# Patient Record
Sex: Male | Born: 1970 | Hispanic: Yes | Marital: Married | Smoking: Current every day smoker
Health system: Southern US, Community
[De-identification: ages and names within clinical notes are randomized; demographics above are authoritative.]

## PROBLEM LIST (undated history)

## (undated) DIAGNOSIS — Z973 Presence of spectacles and contact lenses: Secondary | ICD-10-CM

## (undated) HISTORY — PX: ORIF FIBULA FRACTURE: SHX2121

---

## 2019-09-10 ENCOUNTER — Emergency Department (HOSPITAL_COMMUNITY)
Admission: EM | Admit: 2019-09-10 | Discharge: 2019-09-11 | Disposition: A | Discharge status: Home / Self Care | Payer: Self-pay | Attending: Emergency Medicine | Admitting: Emergency Medicine

## 2019-09-10 ENCOUNTER — Encounter (HOSPITAL_COMMUNITY): Payer: Self-pay | Admitting: Emergency Medicine

## 2019-09-10 ENCOUNTER — Emergency Department (HOSPITAL_COMMUNITY): Payer: Self-pay

## 2019-09-10 DIAGNOSIS — Y9289 Other specified places as the place of occurrence of the external cause: Secondary | ICD-10-CM | POA: Insufficient documentation

## 2019-09-10 DIAGNOSIS — Y939 Activity, unspecified: Secondary | ICD-10-CM | POA: Insufficient documentation

## 2019-09-10 DIAGNOSIS — W11XXXA Fall on and from ladder, initial encounter: Secondary | ICD-10-CM | POA: Insufficient documentation

## 2019-09-10 DIAGNOSIS — Y999 Unspecified external cause status: Secondary | ICD-10-CM | POA: Insufficient documentation

## 2019-09-10 DIAGNOSIS — W19XXXA Unspecified fall, initial encounter: Secondary | ICD-10-CM

## 2019-09-10 DIAGNOSIS — S82891A Other fracture of right lower leg, initial encounter for closed fracture: Secondary | ICD-10-CM

## 2019-09-10 DIAGNOSIS — S8251XA Displaced fracture of medial malleolus of right tibia, initial encounter for closed fracture: Secondary | ICD-10-CM | POA: Insufficient documentation

## 2019-09-10 HISTORY — DX: Other fracture of right lower leg, initial encounter for closed fracture: S82.891A

## 2019-09-10 NOTE — ED Triage Notes (Signed)
Patient reports he slipped and fell off ladder yesterday. Patient presents today with severe swelling and bruising to RLE. Primary location of swelling is R ankle but bruising extends to knee. Denies taking blood thinners.

## 2019-09-11 MED ORDER — IBUPROFEN 600 MG PO TABS
600.0000 mg | ORAL_TABLET | Freq: Four times a day (QID) | ORAL | 0 refills | Status: DC | PRN
Start: 2019-09-11 — End: 2019-09-17

## 2019-09-11 NOTE — ED Notes (Signed)
Ortho paged for splint and crutches 

## 2019-09-11 NOTE — ED Provider Notes (Signed)
[facility] EMERGENCY DEPARTMENT Provider Note   CSN: [id number] Arrival date & time: 09/10/19  1714     History Chief Complaint  Patient presents with  . Leg Injury    Jerry Webb is a 49 y.o. male.  No language interpreter was used (Interpreter was offered, but patient states it is not necessary.).        Jerry Webb is a 49 y.o. male, patient with no known past medical history, presenting to the ED with right leg injury that occurred morning of 7/20. Patient was standing on an extension ladder when the ladder slipped and the latch holding the ladder gave way. He slid to the ground, but the upper ladder section came down on his ankle.  He endorses mild to moderate, throbbing pain. Bruising and swelling.  Previous lateral maleous fracture with surgical repair 25-30 years ago. Denies anticoagulation.  Denies numbness, weakness, neck/back pain, head injury, or any other complaints or injuries.    History reviewed. No pertinent past medical history.  There are no problems to display for this patient.   History reviewed. No pertinent surgical history.     No family history on file.  Social History   Tobacco Use  . Smoking status: Not on file  . Smokeless tobacco: Never Used  Substance Use Topics  . Alcohol use: Yes  . Drug use: Not Currently    Home Medications Prior to Admission medications   Medication Sig Start Date End Date Taking? Authorizing Provider  ibuprofen (ADVIL) 600 MG tablet Take 1 tablet (600 mg total) by mouth every 6 (six) hours as needed. 09/11/19   Lucill Mauck, Hillard Danker, PA-C    Allergies    Patient has no known allergies.  Review of Systems   Review of Systems  Constitutional: Negative for diaphoresis.  Respiratory: Negative for shortness of breath.   Cardiovascular: Negative for chest pain.  Gastrointestinal: Negative for nausea and vomiting.  Musculoskeletal: Positive for arthralgias and joint swelling. Negative  for back pain and neck pain.  Skin: Negative for wound.  Neurological: Negative for syncope, weakness and numbness.    Physical Exam Updated Vital Signs BP (!) 147/102 (BP Location: Left Arm)   Pulse 72   Temp 98.5 F (36.9 C) (Temporal)   Resp 20   Ht 5\' 8"  (1.727 m)   Wt 81.6 kg   SpO2 96%   BMI 27.37 kg/m   Physical Exam Vitals and nursing note reviewed.  Constitutional:      General: He is not in acute distress.    Appearance: He is well-developed. He is not diaphoretic.  HENT:     Head: Normocephalic and atraumatic.  Eyes:     Conjunctiva/sclera: Conjunctivae normal.  Cardiovascular:     Rate and Rhythm: Normal rate and regular rhythm.     Pulses:          Dorsalis pedis pulses are 2+ on the right side and 2+ on the left side.       Posterior tibial pulses are 2+ on the right side and 2+ on the left side.     Comments: Pulses in the right lower extremity were palpated as well as Dopplered.  Pulmonary:     Effort: Pulmonary effort is normal.  Musculoskeletal:     Cervical back: Neck supple.     Comments: Swelling to right ankle. Tenderness over medial malleolus. Bruising to right ankle extending up medial calf. No tenderness or swelling into foot or lower leg.  No tenderness, swelling, deformity, instability to the right lower leg, knee, upper leg, or hip.  Normal motor function intact in all other extremities. No midline spinal tenderness.   Skin:    General: Skin is warm and dry.     Coloration: Skin is not pale.  Neurological:     Mental Status: He is alert.     Comments: Sensation light touch grossly intact in the right lower extremity into the toes. Motor function intact in the right toes.  Psychiatric:        Behavior: Behavior normal.     ED Results / Procedures / Treatments   Labs (all labs ordered are listed, but only abnormal results are displayed) Labs Reviewed - No data to display  EKG None  Radiology DG Tibia/Fibula Right  Result  Date: 09/10/2019 CLINICAL DATA:  Fall and ankle pain EXAM: RIGHT TIBIA AND FIBULA - 2 VIEW COMPARISON:  None. FINDINGS: There is a comminuted mildly displaced fracture seen through the medial malleolus. Overlying soft tissue swelling seen diffusely around the ankle. No proximal fracture is noted. There is diffuse pretibial soft tissue swelling. IMPRESSION: Comminuted mildly displaced medial malleolar fracture. Electronically Signed   By: Jonna Clark M.D.   On: 09/10/2019 19:27   DG Ankle Complete Right  Result Date: 09/10/2019 CLINICAL DATA:  Fall ankle pain EXAM: RIGHT ANKLE - COMPLETE 3+ VIEW COMPARISON:  None. FINDINGS: There is a comminuted mildly displaced fracture seen through the medial malleolus with mild widening of the medial clear space measuring up to 6 mm. There is diffuse overlying soft tissue swelling seen around the ankle. Prior fixation seen within the distal fibula. There is enthesophytes at the syndesmosis. A small ankle joint effusion is present. Dorsal osteophytes at the talonavicular joint IMPRESSION: Comminuted mildly displaced medial malleolar fracture. Electronically Signed   By: Jonna Clark M.D.   On: 09/10/2019 19:27   DG Foot Complete Right  Result Date: 09/10/2019 CLINICAL DATA:  Right ankle pain and bruising EXAM: RIGHT FOOT COMPLETE - 3+ VIEW COMPARISON:  None. FINDINGS: There is a comminuted mildly displaced medial malleolar fracture. There is widening of the medial clear space with overlying soft tissue swelling. Prior fixation is seen through the distal tibia. No other fractures are identified. IMPRESSION: Comminuted mildly displaced medial malleolar fracture. Electronically Signed   By: Jonna Clark M.D.   On: 09/10/2019 19:26    Procedures Procedures (including critical care time)  Medications Ordered in ED Medications - No data to display  ED Course  I have reviewed the triage vital signs and the nursing notes.  Pertinent labs & imaging results that were  available during my care of the patient were reviewed by me and considered in my medical decision making (see chart for details).  Clinical Course as of Sep 11 319  Wed Sep 11, 2019  0109 Spoke with Dr. Dion Saucier, orthopedic surgeon. We reviewed the patient's complaint as well as imaging.  He personally reviewed the imaging as well. He advises placing the patient in a short leg splint, stirrup and posterior.  Nonweightbearing.  Crutches.  Office follow-up.   [SJ]    Clinical Course User Index [SJ] Pinchus Weckwerth, Hillard Danker, PA-C   MDM Rules/Calculators/A&P                          Patient presents with injury to the right ankle.  No evidence of neurovascular compromise. I personally reviewed and interpreted the patient's x-rays.  Comminuted, mildly displaced fracture to the right medial malleolus. Patient placed in a short leg splint, stirrup and posterior.  Circulation, motor, sensory evaluated before and after intact before and after splinting.  Patient requests no narcotic pain medication.  The patient was given instructions for home care as well as return precautions. Patient voices understanding of these instructions, accepts the plan, and is comfortable with discharge.  Findings and plan of care discussed with Dione Booze, MD.   Final Clinical Impression(s) / ED Diagnoses Final diagnoses:  Fall  Displaced fracture of medial malleolus of right tibia, initial encounter for closed fracture    Rx / DC Orders ED Discharge Orders         Ordered    ibuprofen (ADVIL) 600 MG tablet  Every 6 hours PRN     Discontinue  Reprint     09/11/19 0201           Anselm Pancoast, PA-C 09/11/19 0321    Dione Booze, MD 09/11/19 [phone]

## 2019-09-11 NOTE — Discharge Instructions (Addendum)
Fracture care There is evidence of a fracture on the x-ray. Pain:  Antiinflammatory medications: Take 600 mg of ibuprofen every 6 hours or 440 mg (over the counter dose) to 500 mg (prescription dose) of naproxen every 12 hours for the next 3 days. After this time, these medications may be used as needed for pain. Take these medications with food to avoid upset stomach. Choose only one of these medications, do not take them together. Acetaminophen (generic for Tylenol): Should you continue to have additional pain while taking the ibuprofen or naproxen, you may add in acetaminophen as needed. Your daily total maximum amount of acetaminophen from all sources should be limited to 4000mg /day for persons without liver problems, or 2000mg /day for those with liver problems. Ice: May apply ice to the injured area for no more than 15 minutes at a time to reduce swelling and pain. Elevation: Keep the extremity elevated whenever possible to reduce swelling and pain. Splint: Keep the splint clean and dry.  Protect it from water during bathing.  If the splint gets wet, you will need to have it reapplied.  Do not leave a wet splint against the skin as this can cause skin breakdown.  Call the orthopedist office or come to the ED for splint replacement, if needed.  You will be nonweightbearing.  This means put no weight on the injured leg. Follow-up: Follow-up with the orthopedic specialist for any further management of this issue.  Call the number provided to set up an appointment. Return: Return to the emergency department for severely increased pain, numbness, blanching of the skin, or any other major concerns.

## 2019-09-11 NOTE — Progress Notes (Signed)
Orthopedic Tech Progress Note Patient Details:  Jerry Webb 1970/06/29 [id number]  Ortho Devices Type of Ortho Device: Stirrup splint, Post (short leg) splint, Crutches Ortho Device/Splint Location: Lower right extremity Ortho Device/Splint Interventions: Ordered, Application, Adjustment   Post Interventions Patient Tolerated: Well Instructions Provided: Adjustment of device, Care of device, Poper ambulation with device   [name] [name] 09/11/2019, 1:45 AM

## 2019-09-13 ENCOUNTER — Encounter (HOSPITAL_BASED_OUTPATIENT_CLINIC_OR_DEPARTMENT_OTHER): Payer: Self-pay | Admitting: Orthopedic Surgery

## 2019-09-13 ENCOUNTER — Other Ambulatory Visit: Payer: Self-pay

## 2019-09-13 NOTE — Progress Notes (Signed)
Spoke w/ via phone for pre-op interview--- Pt thru [employer] interpreter ID# [phone] Lab needs dos--  No (pre-op orders pending)    Lab results------ no COVID test ------ 09-14-2019 @ 1110 Arrive at ------- 0630 NPO after MN  Medications to take morning of surgery ----- NONE Diabetic medication ----- n/a Patient Special Instructions ----- n/a Pre-Op special Istructions ----- requested spanish interpreter via email to [place] interpreting (copy of email with chart) Patient verbalized understanding of instructions that were given at this phone interview. Patient denies shortness of breath, chest pain, fever, cough a this phone interview.

## 2019-09-14 ENCOUNTER — Other Ambulatory Visit (HOSPITAL_COMMUNITY)
Admission: RE | Admit: 2019-09-14 | Discharge: 2019-09-14 | Disposition: A | Discharge status: Home / Self Care | Payer: Self-pay | Source: Ambulatory Visit | Attending: Orthopedic Surgery | Admitting: Orthopedic Surgery

## 2019-09-14 DIAGNOSIS — Z20822 Contact with and (suspected) exposure to covid-19: Secondary | ICD-10-CM | POA: Insufficient documentation

## 2019-09-14 DIAGNOSIS — Z01812 Encounter for preprocedural laboratory examination: Secondary | ICD-10-CM | POA: Insufficient documentation

## 2019-09-14 LAB — SARS CORONAVIRUS 2 (TAT 6-24 HRS): SARS Coronavirus 2: NEGATIVE

## 2019-09-16 NOTE — Anesthesia Preprocedure Evaluation (Addendum)
Anesthesia Evaluation  Patient identified by MRN, date of birth, ID band Patient awake    Reviewed: Allergy & Precautions, NPO status , Patient's Chart, lab work & pertinent test results  History of Anesthesia Complications Negative for: history of anesthetic complications  Airway Mallampati: II  TM Distance: >3 FB Neck ROM: Full    Dental no notable dental hx.    Pulmonary Current Smoker,    Pulmonary exam normal        Cardiovascular negative cardio ROS Normal cardiovascular exam     Neuro/Psych negative neurological ROS  negative psych ROS   GI/Hepatic negative GI ROS, Neg liver ROS,   Endo/Other  negative endocrine ROS  Renal/GU negative Renal ROS  negative genitourinary   Musculoskeletal RIGHT ANKLE DISPLACED FRACTURE OF MEDIAL MALLEOLUS OF TIBIA, CLOSED FRACTURE   Abdominal   Peds  Hematology negative hematology ROS (+)   Anesthesia Other Findings Day of surgery medications reviewed with patient.  Reproductive/Obstetrics negative OB ROS                            Anesthesia Physical Anesthesia Plan  ASA: II  Anesthesia Plan: General   Post-op Pain Management: GA combined w/ Regional for post-op pain   Induction:   PONV Risk Score and Plan: 1 and Treatment may vary due to age or medical condition, Ondansetron, Dexamethasone and Midazolam  Airway Management Planned: LMA  Additional Equipment: None  Intra-op Plan:   Post-operative Plan: Extubation in OR  Informed Consent: I have reviewed the patients History and Physical, chart, labs and discussed the procedure including the risks, benefits and alternatives for the proposed anesthesia with the patient or authorized representative who has indicated his/her understanding and acceptance.     Dental advisory given and Interpreter used for interveiw  Plan Discussed with: CRNA  Anesthesia Plan Comments:         Anesthesia Quick Evaluation

## 2019-09-17 ENCOUNTER — Encounter (HOSPITAL_BASED_OUTPATIENT_CLINIC_OR_DEPARTMENT_OTHER)
Admission: RE | Disposition: A | Discharge status: Home / Self Care | Payer: Self-pay | Source: Home / Self Care | Attending: Orthopedic Surgery

## 2019-09-17 ENCOUNTER — Other Ambulatory Visit: Payer: Self-pay

## 2019-09-17 ENCOUNTER — Ambulatory Visit (HOSPITAL_BASED_OUTPATIENT_CLINIC_OR_DEPARTMENT_OTHER): Payer: Self-pay | Admitting: Anesthesiology

## 2019-09-17 ENCOUNTER — Ambulatory Visit (HOSPITAL_BASED_OUTPATIENT_CLINIC_OR_DEPARTMENT_OTHER)
Admission: RE | Admit: 2019-09-17 | Discharge: 2019-09-17 | Disposition: A | Discharge status: Home / Self Care | Payer: Self-pay | Attending: Orthopedic Surgery | Admitting: Orthopedic Surgery

## 2019-09-17 ENCOUNTER — Encounter (HOSPITAL_BASED_OUTPATIENT_CLINIC_OR_DEPARTMENT_OTHER): Payer: Self-pay | Admitting: Orthopedic Surgery

## 2019-09-17 DIAGNOSIS — W11XXXA Fall on and from ladder, initial encounter: Secondary | ICD-10-CM | POA: Insufficient documentation

## 2019-09-17 DIAGNOSIS — S8251XA Displaced fracture of medial malleolus of right tibia, initial encounter for closed fracture: Secondary | ICD-10-CM | POA: Insufficient documentation

## 2019-09-17 DIAGNOSIS — F17210 Nicotine dependence, cigarettes, uncomplicated: Secondary | ICD-10-CM | POA: Insufficient documentation

## 2019-09-17 HISTORY — DX: Presence of spectacles and contact lenses: Z97.3

## 2019-09-17 HISTORY — PX: ORIF ANKLE FRACTURE: SHX5408

## 2019-09-17 SURGERY — OPEN REDUCTION INTERNAL FIXATION (ORIF) ANKLE FRACTURE
Anesthesia: General | Site: Ankle | Laterality: Right

## 2019-09-17 MED ORDER — KETOROLAC TROMETHAMINE 30 MG/ML IJ SOLN
INTRAMUSCULAR | Status: DC | PRN
  Administered 2019-09-17: 30 mg via INTRAVENOUS

## 2019-09-17 MED ORDER — HYDROCODONE-ACETAMINOPHEN 5-325 MG PO TABS: 1.0000 | ORAL_TABLET | ORAL | 0 refills | Status: DC | PRN

## 2019-09-17 MED ORDER — FENTANYL CITRATE (PF) 100 MCG/2ML IJ SOLN
INTRAMUSCULAR | Status: AC
  Filled 2019-09-17: qty 2

## 2019-09-17 MED ORDER — PHENYLEPHRINE 40 MCG/ML (10ML) SYRINGE FOR IV PUSH (FOR BLOOD PRESSURE SUPPORT)
PREFILLED_SYRINGE | INTRAVENOUS | Status: AC
  Filled 2019-09-17: qty 10

## 2019-09-17 MED ORDER — FENTANYL CITRATE (PF) 100 MCG/2ML IJ SOLN: 25.0000 ug | INTRAMUSCULAR | Status: DC | PRN

## 2019-09-17 MED ORDER — ONDANSETRON HCL 4 MG/2ML IJ SOLN
INTRAMUSCULAR | Status: AC
  Filled 2019-09-17: qty 2

## 2019-09-17 MED ORDER — OXYCODONE HCL 5 MG/5ML PO SOLN: 5.0000 mg | Freq: Once | ORAL | Status: DC | PRN

## 2019-09-17 MED ORDER — CEFAZOLIN SODIUM-DEXTROSE 2-4 GM/100ML-% IV SOLN
2.0000 g | INTRAVENOUS | Status: AC
  Administered 2019-09-17: 2 g via INTRAVENOUS

## 2019-09-17 MED ORDER — LACTATED RINGERS IV SOLN: INTRAVENOUS | Status: DC

## 2019-09-17 MED ORDER — DEXAMETHASONE SODIUM PHOSPHATE 10 MG/ML IJ SOLN
INTRAMUSCULAR | Status: AC
  Filled 2019-09-17: qty 1

## 2019-09-17 MED ORDER — FENTANYL CITRATE (PF) 100 MCG/2ML IJ SOLN
INTRAMUSCULAR | Status: DC | PRN
  Administered 2019-09-17: 50 ug via INTRAVENOUS

## 2019-09-17 MED ORDER — PROPOFOL 10 MG/ML IV BOLUS
INTRAVENOUS | Status: AC
  Filled 2019-09-17: qty 40

## 2019-09-17 MED ORDER — CLONIDINE HCL (ANALGESIA) 100 MCG/ML EP SOLN
EPIDURAL | Status: DC | PRN
  Administered 2019-09-17: 33 ug
  Administered 2019-09-17: 67 ug

## 2019-09-17 MED ORDER — LIDOCAINE 2% (20 MG/ML) 5 ML SYRINGE
INTRAMUSCULAR | Status: AC
  Filled 2019-09-17: qty 5

## 2019-09-17 MED ORDER — CEFAZOLIN SODIUM-DEXTROSE 2-4 GM/100ML-% IV SOLN
INTRAVENOUS | Status: AC
  Filled 2019-09-17: qty 100

## 2019-09-17 MED ORDER — ASPIRIN EC 81 MG PO TBEC
81.0000 mg | DELAYED_RELEASE_TABLET | Freq: Every day | ORAL | 0 refills | Status: AC
Start: 2019-09-17 — End: 2019-10-17

## 2019-09-17 MED ORDER — MIDAZOLAM HCL 2 MG/2ML IJ SOLN
2.0000 mg | Freq: Once | INTRAMUSCULAR | Status: AC
  Administered 2019-09-17: 1 mg via INTRAVENOUS

## 2019-09-17 MED ORDER — MIDAZOLAM HCL 5 MG/5ML IJ SOLN
INTRAMUSCULAR | Status: DC | PRN
  Administered 2019-09-17: 2 mg via INTRAVENOUS

## 2019-09-17 MED ORDER — FENTANYL CITRATE (PF) 100 MCG/2ML IJ SOLN
100.0000 ug | Freq: Once | INTRAMUSCULAR | Status: AC
  Administered 2019-09-17: 50 ug via INTRAVENOUS

## 2019-09-17 MED ORDER — OXYCODONE HCL 5 MG PO TABS: 5.0000 mg | ORAL_TABLET | Freq: Once | ORAL | Status: DC | PRN

## 2019-09-17 MED ORDER — ACETAMINOPHEN 500 MG PO TABS
ORAL_TABLET | ORAL | Status: AC
  Filled 2019-09-17: qty 2

## 2019-09-17 MED ORDER — ONDANSETRON HCL 4 MG PO TABS: 4.0000 mg | ORAL_TABLET | Freq: Every day | ORAL | 1 refills | Status: AC | PRN

## 2019-09-17 MED ORDER — PROMETHAZINE HCL 25 MG/ML IJ SOLN: 6.2500 mg | INTRAMUSCULAR | Status: DC | PRN

## 2019-09-17 MED ORDER — DEXAMETHASONE SODIUM PHOSPHATE 10 MG/ML IJ SOLN
INTRAMUSCULAR | Status: DC | PRN
  Administered 2019-09-17: 10 mg via INTRAVENOUS

## 2019-09-17 MED ORDER — PROPOFOL 10 MG/ML IV BOLUS
INTRAVENOUS | Status: DC | PRN
  Administered 2019-09-17: 170 mg via INTRAVENOUS
  Administered 2019-09-17: 30 mg via INTRAVENOUS
  Administered 2019-09-17: 50 mg via INTRAVENOUS

## 2019-09-17 MED ORDER — ACETAMINOPHEN 500 MG PO TABS
1000.0000 mg | ORAL_TABLET | Freq: Once | ORAL | Status: AC
  Administered 2019-09-17: 1000 mg via ORAL

## 2019-09-17 MED ORDER — MIDAZOLAM HCL 2 MG/2ML IJ SOLN
INTRAMUSCULAR | Status: AC
  Filled 2019-09-17: qty 2

## 2019-09-17 MED ORDER — PHENYLEPHRINE 40 MCG/ML (10ML) SYRINGE FOR IV PUSH (FOR BLOOD PRESSURE SUPPORT)
PREFILLED_SYRINGE | INTRAVENOUS | Status: DC | PRN
  Administered 2019-09-17 (×2): 80 ug via INTRAVENOUS
  Administered 2019-09-17: 120 ug via INTRAVENOUS

## 2019-09-17 MED ORDER — ONDANSETRON HCL 4 MG/2ML IJ SOLN
INTRAMUSCULAR | Status: DC | PRN
  Administered 2019-09-17: 4 mg via INTRAVENOUS

## 2019-09-17 MED ORDER — HYDROCODONE-ACETAMINOPHEN 5-325 MG PO TABS: 1.0000 | ORAL_TABLET | ORAL | 0 refills | Status: AC | PRN

## 2019-09-17 MED ORDER — LIDOCAINE 2% (20 MG/ML) 5 ML SYRINGE
INTRAMUSCULAR | Status: DC | PRN
  Administered 2019-09-17: 100 mg via INTRAVENOUS

## 2019-09-17 MED ORDER — KETOROLAC TROMETHAMINE 30 MG/ML IJ SOLN
INTRAMUSCULAR | Status: AC
  Filled 2019-09-17: qty 1

## 2019-09-17 MED ORDER — BUPIVACAINE-EPINEPHRINE (PF) 0.5% -1:200000 IJ SOLN
INTRAMUSCULAR | Status: DC | PRN
  Administered 2019-09-17: 10 mL via PERINEURAL
  Administered 2019-09-17: 20 mL via PERINEURAL

## 2019-09-17 MED ORDER — METHOCARBAMOL 500 MG PO TABS: 500.0000 mg | ORAL_TABLET | Freq: Four times a day (QID) | ORAL | 1 refills | Status: AC | PRN

## 2019-09-17 SURGICAL SUPPLY — 72 items
BANDAGE ESMARK 6X9 LF (GAUZE/BANDAGES/DRESSINGS) ×1 IMPLANT
BIT DRILL CANN 2.7 (BIT) ×1
BIT DRILL CANN 2.7MM (BIT) ×1
BIT DRILL SRG 2.7XCANN AO CPLG (BIT) ×1 IMPLANT
BIT DRL SRG 2.7XCANN AO CPLNG (BIT) ×1
BLADE SURG 15 STRL LF DISP TIS (BLADE) ×2 IMPLANT
BLADE SURG 15 STRL SS (BLADE) ×4
BNDG COHESIVE 4X5 TAN STRL (GAUZE/BANDAGES/DRESSINGS) ×3 IMPLANT
BNDG ELASTIC 4X5.8 VLCR STR LF (GAUZE/BANDAGES/DRESSINGS) ×3 IMPLANT
BNDG ELASTIC 6X5.8 VLCR STR LF (GAUZE/BANDAGES/DRESSINGS) ×3 IMPLANT
BNDG ESMARK 6X9 LF (GAUZE/BANDAGES/DRESSINGS) ×3
BNDG GAUZE ELAST 4 BULKY (GAUZE/BANDAGES/DRESSINGS) ×3 IMPLANT
CHLORAPREP W/TINT 26 (MISCELLANEOUS) ×3 IMPLANT
CLOSURE STERI-STRIP 1/2X4 (GAUZE/BANDAGES/DRESSINGS) ×1
CLSR STERI-STRIP ANTIMIC 1/2X4 (GAUZE/BANDAGES/DRESSINGS) ×2 IMPLANT
COVER BACK TABLE 60X90IN (DRAPES) ×3 IMPLANT
COVER MAYO STAND STRL (DRAPES) ×3 IMPLANT
COVER WAND RF STERILE (DRAPES) ×3 IMPLANT
CUFF TOURN SGL QUICK 24 (TOURNIQUET CUFF)
CUFF TOURN SGL QUICK 34 (TOURNIQUET CUFF)
CUFF TRNQT CYL 24X4X16.5-23 (TOURNIQUET CUFF) IMPLANT
CUFF TRNQT CYL 34X4.125X (TOURNIQUET CUFF) IMPLANT
DECANTER SPIKE VIAL GLASS SM (MISCELLANEOUS) IMPLANT
DRAPE EXTREMITY T 121X128X90 (DISPOSABLE) ×3 IMPLANT
DRAPE IMP U-DRAPE 54X76 (DRAPES) ×3 IMPLANT
DRAPE OEC MINIVIEW 54X84 (DRAPES) ×3 IMPLANT
DRAPE U-SHAPE 47X51 STRL (DRAPES) ×3 IMPLANT
DRSG EMULSION OIL 3X3 NADH (GAUZE/BANDAGES/DRESSINGS) ×3 IMPLANT
DRSG PAD ABDOMINAL 8X10 ST (GAUZE/BANDAGES/DRESSINGS) ×3 IMPLANT
ELECT REM PT RETURN 9FT ADLT (ELECTROSURGICAL) ×3
ELECTRODE REM PT RTRN 9FT ADLT (ELECTROSURGICAL) ×1 IMPLANT
GAUZE SPONGE 4X4 12PLY STRL (GAUZE/BANDAGES/DRESSINGS) ×3 IMPLANT
GLOVE BIO SURGEON STRL SZ7.5 (GLOVE) ×6 IMPLANT
GLOVE BIOGEL PI IND STRL 8 (GLOVE) ×2 IMPLANT
GLOVE BIOGEL PI INDICATOR 8 (GLOVE) ×4
GOWN STRL REUS W/ TWL LRG LVL3 (GOWN DISPOSABLE) ×2 IMPLANT
GOWN STRL REUS W/ TWL XL LVL3 (GOWN DISPOSABLE) ×1 IMPLANT
GOWN STRL REUS W/TWL LRG LVL3 (GOWN DISPOSABLE) ×4
GOWN STRL REUS W/TWL XL LVL3 (GOWN DISPOSABLE) ×2
K-WIRE ORTHOPEDIC 1.4X150L (WIRE) ×6
KWIRE ORTHOPEDIC 1.4X150L (WIRE) ×2 IMPLANT
NEEDLE HYPO 22GX1.5 SAFETY (NEEDLE) IMPLANT
NS IRRIG 1000ML POUR BTL (IV SOLUTION) ×3 IMPLANT
PACK BASIN DAY SURGERY FS (CUSTOM PROCEDURE TRAY) ×3 IMPLANT
PAD CAST 3X4 CTTN HI CHSV (CAST SUPPLIES) ×1 IMPLANT
PAD CAST 4YDX4 CTTN HI CHSV (CAST SUPPLIES) ×1 IMPLANT
PADDING CAST ABS 4INX4YD NS (CAST SUPPLIES) ×4
PADDING CAST ABS COTTON 4X4 ST (CAST SUPPLIES) ×2 IMPLANT
PADDING CAST COTTON 3X4 STRL (CAST SUPPLIES) ×2
PADDING CAST COTTON 4X4 STRL (CAST SUPPLIES) ×2
PADDING CAST COTTON 6X4 STRL (CAST SUPPLIES) ×3 IMPLANT
PENCIL BUTTON HOLSTER BLD 10FT (ELECTRODE) ×3 IMPLANT
SCREW 40X4.0MM (Screw) ×6 IMPLANT
SPLINT FAST PLASTER 5X30 (CAST SUPPLIES) ×2
SPLINT PLASTER CAST FAST 5X30 (CAST SUPPLIES) ×1 IMPLANT
SPONGE LAP 4X18 RFD (DISPOSABLE) ×3 IMPLANT
SUCTION FRAZIER HANDLE 10FR (MISCELLANEOUS) ×2
SUCTION TUBE FRAZIER 10FR DISP (MISCELLANEOUS) ×1 IMPLANT
SUT ETHILON 3 0 PS 1 (SUTURE) IMPLANT
SUT MNCRL AB 4-0 PS2 18 (SUTURE) IMPLANT
SUT MON AB 2-0 CT1 36 (SUTURE) IMPLANT
SUT MON AB 3-0 SH 27 (SUTURE)
SUT MON AB 3-0 SH27 (SUTURE) IMPLANT
SUT VIC AB 0 CT1 36 (SUTURE) ×3 IMPLANT
SUT VIC AB 2-0 SH 27 (SUTURE)
SUT VIC AB 2-0 SH 27XBRD (SUTURE) IMPLANT
SYR BULB EAR ULCER 3OZ GRN STR (SYRINGE) ×3 IMPLANT
SYR CONTROL 10ML LL (SYRINGE) IMPLANT
TOWEL OR 17X26 10 PK STRL BLUE (TOWEL DISPOSABLE) ×6 IMPLANT
TUBE CONNECTING 12'X1/4 (SUCTIONS) ×1
TUBE CONNECTING 12X1/4 (SUCTIONS) ×2 IMPLANT
UNDERPAD 30X36 HEAVY ABSORB (UNDERPADS AND DIAPERS) ×3 IMPLANT

## 2019-09-17 NOTE — Progress Notes (Addendum)
Assisted Dr. Stephannie Peters with right, ultrasound guided,popiteal, adductor canal block. Side rails up, monitors on throughout procedure. See vital signs in flow sheet. Tolerated Procedure well.

## 2019-09-17 NOTE — Anesthesia Postprocedure Evaluation (Signed)
Anesthesia Post Note  Patient: Jerry Webb  Procedure(s) Performed: OPEN REDUCTION INTERNAL FIXATION RIGHT ANKLE MEDIAL MALLEOLUS (Right Ankle)     Patient location during evaluation: PACU Anesthesia Type: General Level of consciousness: awake and alert and oriented Pain management: pain level controlled Vital Signs Assessment: post-procedure vital signs reviewed and stable Respiratory status: spontaneous breathing, nonlabored ventilation and respiratory function stable Cardiovascular status: blood pressure returned to baseline Postop Assessment: no apparent nausea or vomiting Anesthetic complications: no   No complications documented.  Last Vitals:  Vitals:   09/17/19 1056 09/17/19 1119  BP: 122/84 (!) 110/87  Pulse: 71 69  Resp: 17 16  Temp: 36.5 C   SpO2: 99% 100%    Last Pain:  Vitals:   09/17/19 1119  TempSrc:   PainSc: 0-No pain   Pain Goal:                   Kaylyn Layer

## 2019-09-17 NOTE — Interval H&P Note (Signed)
History and Physical Interval Note:  09/17/2019 6:42 AM  Marijean Bravo  has presented today for surgery, with the diagnosis of RIGHT ANKLE DISPLACED FRACTURE OF MEDIAL MALLEOLUS OF TIBIA, CLOSED FRACTURE.  The various methods of treatment have been discussed with the patient and family. After consideration of risks, benefits and other options for treatment, the patient has consented to  Procedure(s): OPEN REDUCTION INTERNAL FIXATION RIGHT ANKLE MEDIAL MALLEOLUS (Right) as a surgical intervention.  The patient's history has been reviewed, patient examined, no change in status, stable for surgery.  I have reviewed the patient's chart and labs.  Questions were answered to the patient's satisfaction.     Sheral Apley

## 2019-09-17 NOTE — Transfer of Care (Signed)
Immediate Anesthesia Transfer of Care Note  Patient: Jerry Webb  Procedure(s) Performed: OPEN REDUCTION INTERNAL FIXATION RIGHT ANKLE MEDIAL MALLEOLUS (Right Ankle)  Patient Location: PACU  Anesthesia Type: general  Level of Consciousness: sedated  Airway & Oxygen Therapy: Patient Spontanous Breathing  Post-op Assessment: Report given to RN  Post vital signs: Reviewed and stable  Last Vitals:  Vitals Value Taken Time  BP 112/75 09/17/19 0945  Temp    Pulse 77 09/17/19 0947  Resp 11 09/17/19 0947  SpO2 96 % 09/17/19 0947  Vitals shown include unvalidated device data.  Last Pain:  Vitals:   09/17/19 0649  TempSrc: Oral  PainSc: 0-No pain         Complications: No complications documented.

## 2019-09-17 NOTE — Anesthesia Procedure Notes (Signed)
Anesthesia Regional Block: Popliteal block   Pre-Anesthetic Checklist: ,, timeout performed, Correct Patient, Correct Site, Correct Laterality, Correct Procedure, Correct Position, site marked, Risks and benefits discussed, pre-op evaluation,  At surgeon's request and post-op pain management  Laterality: Right  Prep: Maximum Sterile Barrier Precautions used, chloraprep       Needles:  Injection technique: Single-shot  Needle Type: Echogenic Stimulator Needle     Needle Length: 9cm  Needle Gauge: 22     Additional Needles:   Procedures:,,,, ultrasound used (permanent image in chart),,,,  Narrative:  Start time: 09/17/2019 7:52 AM End time: 09/17/2019 7:54 AM Injection made incrementally with aspirations every 5 mL.  Performed by: Personally  Anesthesiologist: Kaylyn Layer, MD  Additional Notes: Risks, benefits, and alternative discussed. Patient gave consent for procedure. Patient prepped and draped in sterile fashion. Sedation administered, patient remains easily responsive to voice. Relevant anatomy identified with ultrasound guidance. Local anesthetic given in 5cc increments with no signs or symptoms of intravascular injection. No pain or paraesthesias with injection. Patient monitored throughout procedure with signs of LAST or immediate complications. Tolerated well. Ultrasound image placed in chart.  Amalia Greenhouse, MD

## 2019-09-17 NOTE — Op Note (Addendum)
09/17/2019  9:39 AM  PATIENT:  Jerry Webb    PRE-OPERATIVE DIAGNOSIS:  RIGHT ANKLE DISPLACED FRACTURE OF MEDIAL MALLEOLUS OF TIBIA, CLOSED FRACTURE  POST-OPERATIVE DIAGNOSIS:  Same  PROCEDURE:  OPEN REDUCTION INTERNAL FIXATION RIGHT ANKLE MEDIAL MALLEOLUS  SURGEON:  Sheral Apley, MD  ASSISTANT: Daun Peacock, PA-C, he was present and scrubbed throughout the case, critical for completion in a timely fashion, and for retraction, instrumentation, and closure.   ANESTHESIA:   General  PREOPERATIVE INDICATIONS:  [name] is a  49 y.o. male with a diagnosis of RIGHT ANKLE DISPLACED FRACTURE OF MEDIAL MALLEOLUS OF TIBIA, CLOSED FRACTURE and elected for surgical management.    The risks benefits and alternatives were discussed with the patient preoperatively including but not limited to the risks of infection, bleeding, nerve injury, cardiopulmonary complications, the need for revision surgery, among others, and the patient was willing to proceed.  OPERATIVE IMPLANTS: Canulated screws  OPERATIVE FINDINGS: Stable reduction  BLOOD LOSS: min  COMPLICATIONS: none  TOURNIQUET TIME:  OPERATIVE PROCEDURE:  Patient was identified in the preoperative holding area and site was marked by me He was transported to the operating theater and placed on the table in supine position taking care to pad all bony prominences. After a preincinduction time out anesthesia was induced. The right lower extremity was prepped and draped in normal sterile fashion and a pre-incision timeout was performed. Jerry Webb received ancef for preoperative antibiotics.   I performed a fluoroscopic examination of the ankle to confirm no unstable fracture on the lateral side.   I then made a 5 cm incision centered over the medial malleolus I dissected down carefully protecting the saphenous vein. I then incised the periosteum and immediately noted the fracture of the medial malleolus with  interposed periosteum and soft tissue. I resected the soft tissue interposition. I then performed a reduction maneuver with a tenaculum.  I was also able to manipulate the corner of his pilon that had displaced. The anterior screw would stabilize this into place.    I took fluoroscopic images and was happy with the reduction the ankle. I palpated the anterior and posterior aspect and around the joint not felt no step off. I then placed 2 K wires under fluoroscopic guidance one anterior and one posterior of the medial malleolus taking care to not penetrate the joint. I was happy with the position of both on the AP mortise and lateral views. I then drilled the outer cortex and selected two 44 mm partially-threaded cancellus screws. I inserted these under fluoroscopic guidance and again no penetration of the subchondral bone was noted. Again a good bite on both of these and sequentially tightened them. I then removed the K wires to final fluoroscopic views and was happy with the reduction of the fracture as well as placement of screws. I then thoroughly irrigated the wound and closed it with 2.0 and 4.0 Monocryl for the skin. Sterile dressing was placed and the patient was placed in a fracture boot.    POST OPERATIVE PLAN: Non-weightbearing. DVT prophylaxis will consist of ASA and mobilization

## 2019-09-17 NOTE — Anesthesia Procedure Notes (Signed)
Anesthesia Regional Block: Adductor canal block   Pre-Anesthetic Checklist: ,, timeout performed, Correct Patient, Correct Site, Correct Laterality, Correct Procedure, Correct Position, site marked, Risks and benefits discussed, pre-op evaluation,  At surgeon's request and post-op pain management  Laterality: Right  Prep: Maximum Sterile Barrier Precautions used, chloraprep       Needles:  Injection technique: Single-shot  Needle Type: Echogenic Stimulator Needle     Needle Length: 9cm  Needle Gauge: 22     Additional Needles:   Procedures:,,,, ultrasound used (permanent image in chart),,,,  Narrative:  Start time: 09/17/2019 7:48 AM End time: 09/17/2019 7:50 AM Injection made incrementally with aspirations every 5 mL.  Performed by: Personally  Anesthesiologist: Kaylyn Layer, MD  Additional Notes: Risks, benefits, and alternative discussed. Patient gave consent for procedure. Patient prepped and draped in sterile fashion. Sedation administered, patient remains easily responsive to voice. Relevant anatomy identified with ultrasound guidance. Local anesthetic given in 5cc increments with no signs or symptoms of intravascular injection. No pain or paraesthesias with injection. Patient monitored throughout procedure with signs of LAST or immediate complications. Tolerated well. Ultrasound image placed in chart.  Amalia Greenhouse, MD

## 2019-09-17 NOTE — Discharge Instructions (Signed)
Displaced Bimalleolar Ankle Fracture Treated With ORIF, Care After This sheet gives you information about how to care for yourself after your procedure. Your health care provider may also give you more specific instructions. If you have problems or questions, contact your health care provider. What can I expect after the procedure? After the procedure, it is common to have:  Pain.  Swelling.  A small amount of fluid from your incision. Follow these instructions at home: If you have a splint or boot:  Wear the splint or boot as told by your health care provider. Remove it only as told by your health care provider.  Loosen the splint or boot if your toes tingle, become numb, or turn cold and blue.  Keep the splint or boot clean.  If the splint or boot is not waterproof: ? Do not let it get wet. ? Cover it with a watertight covering when you take a bath or a shower. If you have a cast:  Do not stick anything inside the cast to scratch your skin. Doing that increases your risk of infection.  Check the skin around the cast every day. Tell your health care provider about any concerns.  You may put lotion on dry skin around the edges of the cast. Do not put lotion on the skin underneath the cast.  Keep the cast clean.  If the cast is not waterproof: ? Do not let it get wet. ? Cover it with a watertight covering when you take a bath or a shower. Bathing  Do not take baths, swim, or use a hot tub until your health care provider approves. Ask your health care provider if you can take showers.  If your splint, boot, or cast is not waterproof, cover it with a watertight covering when you take a bath or a shower.  Keep the bandage (dressing) dry until your health care provider says it can be removed. Incision care   Follow instructions from your health care provider about how to take care of your incision. Make sure you: ? Wash your hands with soap and water before you change your  bandage (dressing). If soap and water are not available, use hand sanitizer. ? Change your dressing as told by your health care provider. ? Leave stitches (sutures), skin glue, or adhesive strips in place. These skin closures may need to stay in place for 2 weeks or longer. If adhesive strip edges start to loosen and curl up, you may trim the loose edges. Do not remove adhesive strips completely unless your health care provider tells you to do that.  Check your incision area every day for signs of infection. Check for: ? Redness. ? More pain or swelling. ? Blood or more fluid. ? Warmth. ? Pus or a bad smell. Managing pain, stiffness, and swelling   If directed, put ice on the affected area. ? If you have a removable splint or boot, remove it as told by your health care provider. ? Put ice in a plastic bag. ? Place a towel between your skin and the bag or between your cast and the bag. ? Leave the ice on for 20 minutes, 2-3 times a day.  Move your toes often to avoid stiffness and to lessen swelling.  Raise (elevate) the injured area above the level of your heart while you are sitting or lying down. To do this, try putting a few pillows under your leg and ankle. Driving  Do not drive or use heavy machinery  while taking prescription pain medicine.  Ask your health care provider when it is safe to drive if you have a splint, boot, or cast on your foot. Activity  Return to your normal activities as told by your health care provider. Ask your health care provider what activities are safe for you.  Do exercises as told by your health care provider or physical therapist.  Do not use your injured limb to support (bear) your body weight until your health care provider says that you can. Follow weight-bearing restrictions as told. Use crutches or other devices to help you move around (assistive devices) as directed. General instructions  Do not put pressure on any part of the splint or cast  until it is fully hardened. This may take several hours.  Do not use any products that contain nicotine or tobacco, such as cigarettes and e-cigarettes. These can delay bone healing. If you need help quitting, ask your health care provider.  Take over-the-counter and prescription medicines only as told by your health care provider.  If you are taking prescription pain medicine, take actions to prevent or treat constipation. Your health care provider may recommend that you: ? Drink enough fluid to keep your urine pale yellow. ? Eat foods that are high in fiber, such as fresh fruits and vegetables, whole grains, and beans. ? Limit foods that are high in fat and processed sugars, such as fried or sweet foods. ? Take an over-the-counter or prescription medicine for constipation.  Keep all follow-up visits as told by your health care provider. This is important. Contact a health care provider if:  You have a fever.  Your pain medicine is not helping.  You have redness around your incision.  You have more swelling or pain around your incision.  You have more fluid or blood coming from your incision or leaking through your cast.  Your incision feels warm to the touch.  You have pus or a bad smell coming from your incision or from your cast or dressing. Get help right away if:  The edges of your incision come apart after the stitches or staples have been taken out.  You have chest pain.  You have difficulty breathing.  You have numbness or tingling in your foot or leg.  Your foot becomes cold, pale, or blue. Summary  After the procedure, it is common to have some pain and swelling.  If your splint, boot, or cast is not waterproof, do not let it get wet.  Contact your health care provider if you have severe pain or swelling, or if you have more fluids coming from your incision or leaking through your cast.  Get help right away if you have numbness or tingling in your foot or  leg, or if your foot becomes cold, pale, or blue. This information is not intended to replace advice given to you by your health care provider. Make sure you discuss any questions you have with your health care provider. Document Revised: 01/20/2017 Document Reviewed: 11/23/2016 Elsevier Patient Education  2020 [employer].    Post Anesthesia Home Care Instructions  Activity: Get plenty of rest for the remainder of the day. A responsible individual must stay with you for 24 hours following the procedure.  For the next 24 hours, DO NOT: -Drive a car -Advertising copywriter -Drink alcoholic beverages -Take any medication unless instructed by your physician -Make any legal decisions or sign important papers.  Meals: Start with liquid foods such as gelatin or soup.  Progress to regular foods as tolerated. Avoid greasy, spicy, heavy foods. If nausea and/or vomiting occur, drink only clear liquids until the nausea and/or vomiting subsides. Call your physician if vomiting continues.  Special Instructions/Symptoms: Your throat may feel dry or sore from the anesthesia or the breathing tube placed in your throat during surgery. If this causes discomfort, gargle with warm salt water. The discomfort should disappear within 24 hours.  If you had a scopolamine patch placed behind your ear for the management of post- operative nausea and/or vomiting:  1. The medication in the patch is effective for 72 hours, after which it should be removed.  Wrap patch in a tissue and discard in the trash. Wash hands thoroughly with soap and water. 2. You may remove the patch earlier than 72 hours if you experience unpleasant side effects which may include dry mouth, dizziness or visual disturbances. 3. Avoid touching the patch. Wash your hands with soap and water after contact with the patch.    Regional Anesthesia Blocks  1. Numbness or the inability to move the "blocked" extremity may last from 3-48 hours after  placement. The length of time depends on the medication injected and your individual response to the medication. If the numbness is not going away after 48 hours, call your surgeon.  2. The extremity that is blocked will need to be protected until the numbness is gone and the  Strength has returned. Because you cannot feel it, you will need to take extra care to avoid injury. Because it may be weak, you may have difficulty moving it or using it. You may not know what position it is in without looking at it while the block is in effect.  3. For blocks in the legs and feet, returning to weight bearing and walking needs to be done carefully. You will need to wait until the numbness is entirely gone and the strength has returned. You should be able to move your leg and foot normally before you try and bear weight or walk. You will need someone to be with you when you first try to ensure you do not fall and possibly risk injury.  4. Bruising and tenderness at the needle site are common side effects and will resolve in a few days.  5. Persistent numbness or new problems with movement should be communicated to the surgeon or the [facility] [phone] [facility] [phone]).

## 2019-09-17 NOTE — Anesthesia Procedure Notes (Signed)
Procedure Name: LMA Insertion Date/Time: 09/17/2019 8:57 AM Performed by: Briant Sites, CRNA Pre-anesthesia Checklist: Patient identified, Emergency Drugs available, Suction available and Patient being monitored Patient Re-evaluated:Patient Re-evaluated prior to induction Oxygen Delivery Method: Circle system utilized Preoxygenation: Pre-oxygenation with 100% oxygen Induction Type: IV induction Ventilation: Mask ventilation without difficulty LMA: LMA inserted LMA Size: 4.0 Number of attempts: 1 Airway Equipment and Method: Bite block Placement Confirmation: positive ETCO2 Tube secured with: Tape Dental Injury: Teeth and Oropharynx as per pre-operative assessment  Comments: lma initially w leak, reseated, additional propofol.

## 2019-09-17 NOTE — H&P (Signed)
ORTHOPAEDIC CONSULTATION  REQUESTING PHYSICIAN: Renette Butters, MD  Chief Complaint: Right ankle fracture  HPI: Jerry Webb is a 49 y.o. male who complains of a fall and R medial mal fracture. Remote h/o Lateral mal fracture  Past Medical History:  Diagnosis Date  . Closed right ankle fracture 09/10/2019   fell from ladder  . Wears glasses    Past Surgical History:  Procedure Laterality Date  . ORIF FIBULA FRACTURE Right approx. 1990  in [place]   per pt retained hardware   Social History   Socioeconomic History  . Marital status: Married    Spouse name: Not on file  . Number of children: Not on file  . Years of education: Not on file  . Highest education level: Not on file  Occupational History  . Not on file  Tobacco Use  . Smoking status: Current Every Day Smoker    Packs/day: 0.25    Years: 2.00    Pack years: 0.50    Types: Cigarettes  . Smokeless tobacco: Never Used  . Tobacco comment: 09-13-2019  per pt smokes 2-3 cig per day and stopped smoking last week  Substance and Sexual Activity  . Alcohol use: Not Currently  . Drug use: Never  . Sexual activity: Not on file  Other Topics Concern  . Not on file  Social History Narrative  . Not on file   Social Determinants of Health   Financial Resource Strain:   . Difficulty of Paying Living Expenses:   Food Insecurity:   . Worried About Charity fundraiser in the Last Year:   . Arboriculturist in the Last Year:   Transportation Needs:   . Film/video editor (Medical):   Marland Kitchen Lack of Transportation (Non-Medical):   Physical Activity:   . Days of Exercise per Week:   . Minutes of Exercise per Session:   Stress:   . Feeling of Stress :   Social Connections:   . Frequency of Communication with Friends and Family:   . Frequency of Social Gatherings with Friends and Family:   . Attends Religious Services:   . Active Member of Clubs or Organizations:   . Attends Archivist  Meetings:   Marland Kitchen Marital Status:    History reviewed. No pertinent family history. No Known Allergies Prior to Admission medications   Medication Sig Start Date End Date Taking? Authorizing Provider  acetaminophen (TYLENOL) 500 MG tablet Take 500 mg by mouth every 6 (six) hours as needed.   Yes [provider]  ibuprofen (ADVIL) 600 MG tablet Take 1 tablet (600 mg total) by mouth every 6 (six) hours as needed. 09/11/19  Yes Joy, Shawn C, PA-C   No results found.  Positive ROS: All other systems have been reviewed and were otherwise negative with the exception of those mentioned in the HPI and as above.  Labs cbc No results for input(s): WBC, HGB, HCT, PLT in the last 72 hours.  Labs inflam No results for input(s): CRP in the last 72 hours.  Invalid input(s): ESR  Labs coag No results for input(s): INR, PTT in the last 72 hours.  Invalid input(s): PT  No results for input(s): NA, K, CL, CO2, GLUCOSE, BUN, CREATININE, CALCIUM in the last 72 hours.  Physical Exam: There were no vitals filed for this visit. General: Alert, no acute distress Cardiovascular: No pedal edema Respiratory: No cyanosis, no use of accessory musculature GI: No organomegaly,  abdomen is soft and non-tender Skin: No lesions in the area of chief complaint other than those listed below in MSK exam.  Neurologic: Sensation intact distally save for the below mentioned MSK exam Psychiatric: Patient is competent for consent with normal mood and affect Lymphatic: No axillary or cervical lymphadenopathy  MUSCULOSKELETAL:  RLE: swelling controlled, NVI. Abrasions benign Other extremities are atraumatic with painless ROM and NVI.  Assessment: R medial mal fracture with some pilon involvement.   Plan: ORIF today I discussed the risks of this surgery and he wishes to proceed.    Renette Butters, MD    09/17/2019 6:40 AM

## 2019-09-18 ENCOUNTER — Encounter (HOSPITAL_BASED_OUTPATIENT_CLINIC_OR_DEPARTMENT_OTHER): Payer: Self-pay | Admitting: Orthopedic Surgery

## 2021-11-06 IMAGING — CR DG FOOT COMPLETE 3+V*R*
3 series · 3 of 3 positions shown · non-contrast
Comparison: None.

CLINICAL DATA: Right ankle pain and bruising

EXAM:
RIGHT FOOT COMPLETE - 3+ VIEW

[foot ap]
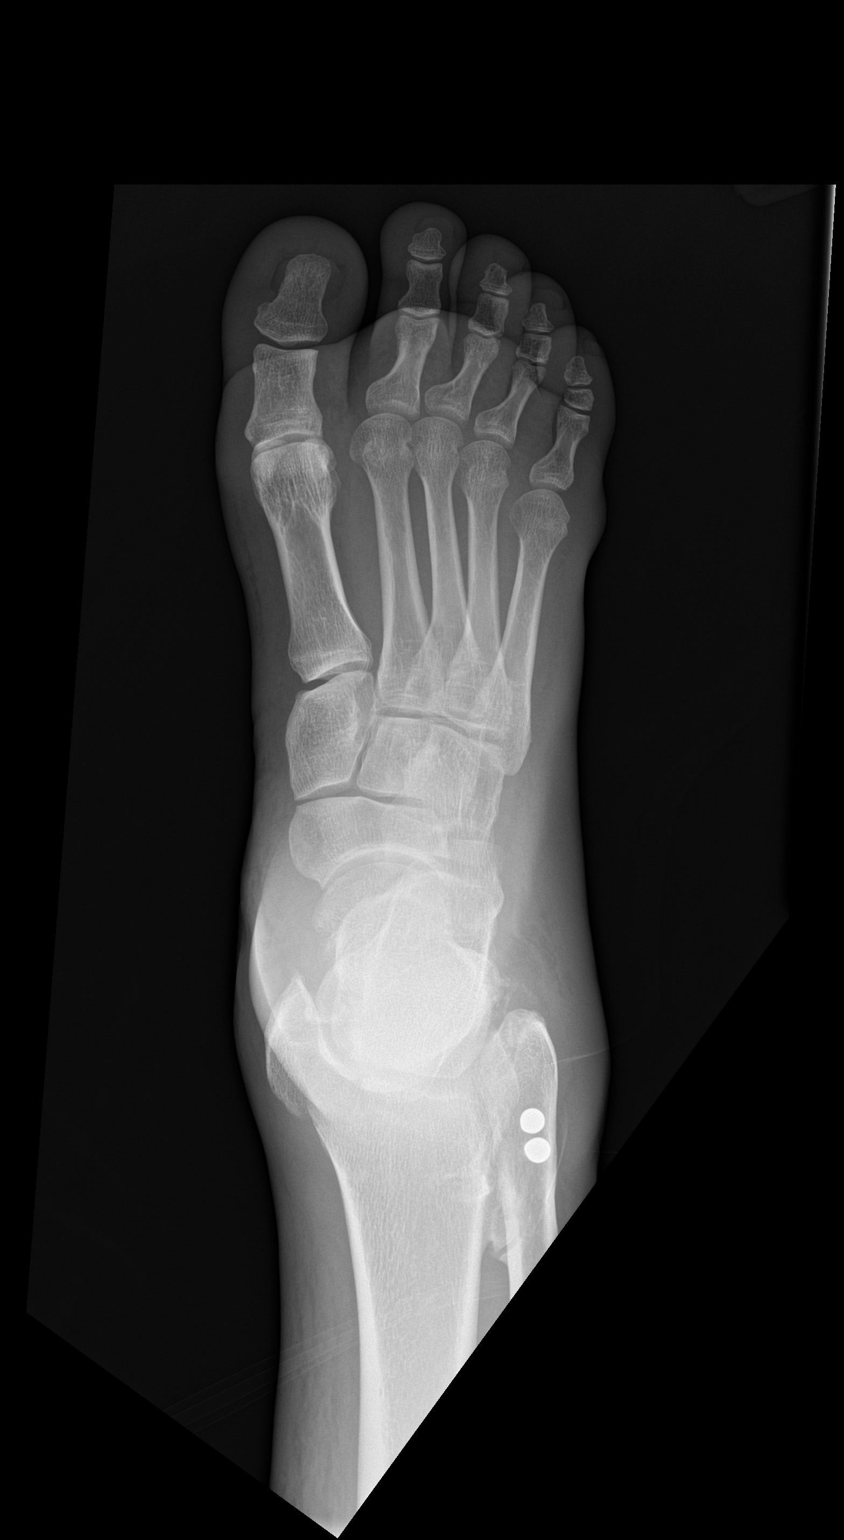

[foot obl]
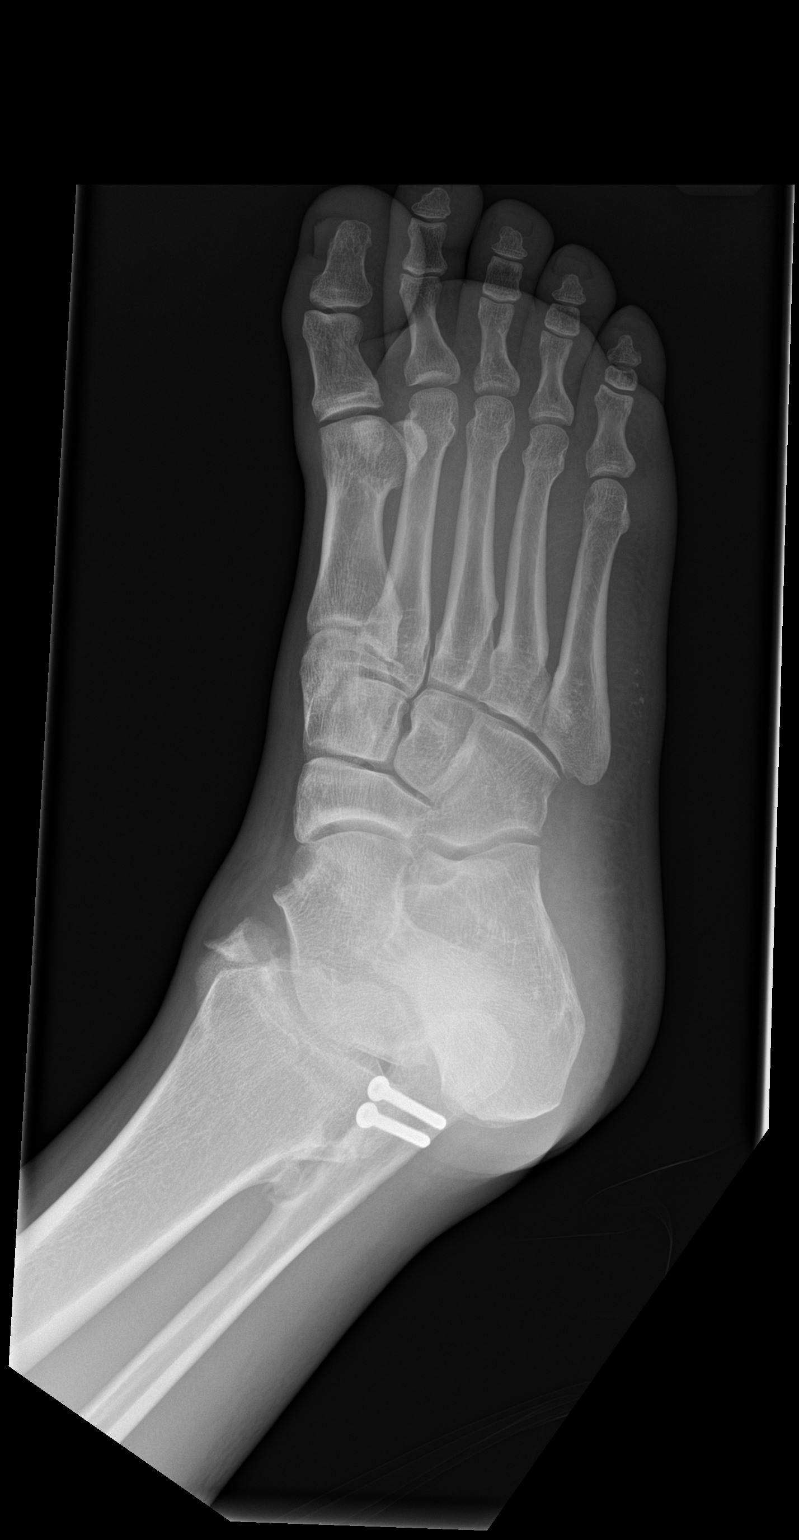

[foot lat]
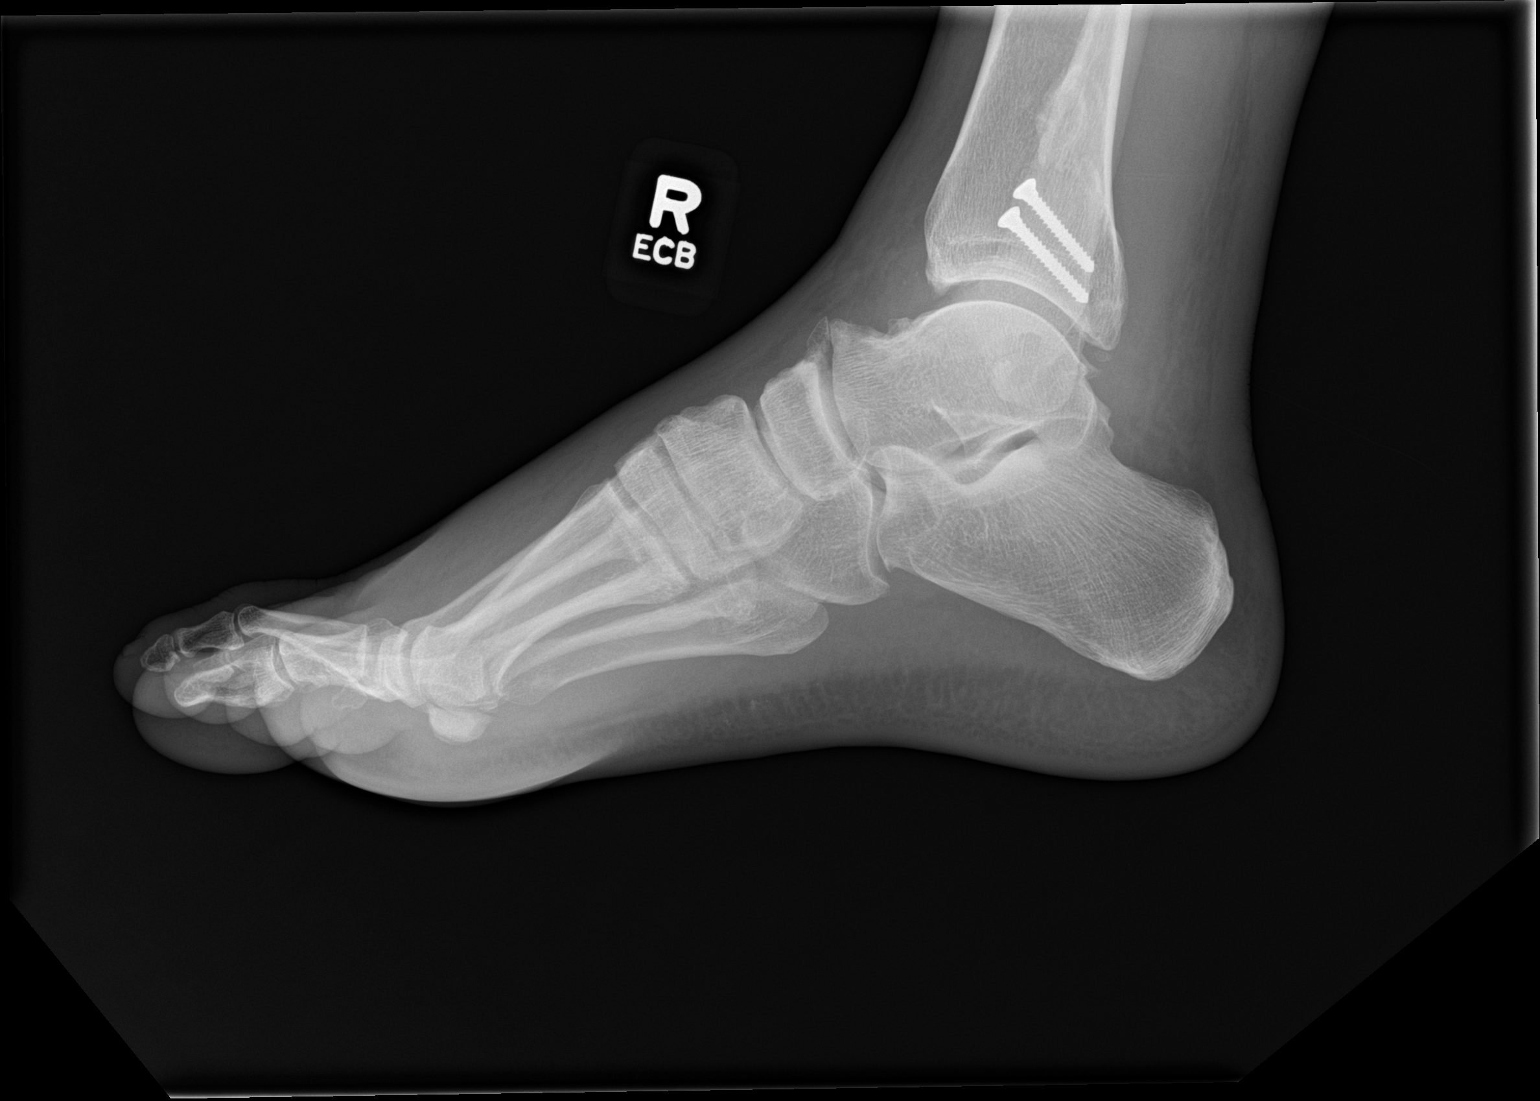

[3 of 3 positions shown; findings below may reference images not displayed]

FINDINGS: There is a comminuted mildly displaced medial malleolar fracture.
There is widening of the medial clear space with overlying soft
tissue swelling. Prior fixation is seen through the distal tibia. No
other fractures are identified.
IMPRESSION: Comminuted mildly displaced medial malleolar fracture.
